# Patient Record
Sex: Male | Born: 1970 | Race: White | Hispanic: No | Smoking: Never smoker
Health system: Southern US, Community
[De-identification: ages and names within clinical notes are randomized; demographics above are authoritative.]

---

## 2008-07-20 ENCOUNTER — Emergency Department: Payer: Self-pay | Admitting: Emergency Medicine

## 2020-08-21 ENCOUNTER — Other Ambulatory Visit: Payer: Self-pay | Admitting: Occupational Medicine

## 2020-08-21 ENCOUNTER — Other Ambulatory Visit: Payer: Self-pay

## 2020-08-21 ENCOUNTER — Ambulatory Visit: Payer: Self-pay

## 2020-08-21 DIAGNOSIS — M79641 Pain in right hand: Secondary | ICD-10-CM

## 2020-09-28 ENCOUNTER — Ambulatory Visit
Admission: EM | Admit: 2020-09-28 | Discharge: 2020-09-28 | Disposition: A | Discharge status: Home / Self Care | Payer: BC Managed Care – PPO | Attending: Family Medicine | Admitting: Family Medicine

## 2020-09-28 ENCOUNTER — Other Ambulatory Visit: Payer: Self-pay

## 2020-09-28 DIAGNOSIS — Z20822 Contact with and (suspected) exposure to covid-19: Secondary | ICD-10-CM

## 2020-09-28 DIAGNOSIS — U071 COVID-19: Secondary | ICD-10-CM | POA: Diagnosis not present

## 2020-09-28 DIAGNOSIS — J209 Acute bronchitis, unspecified: Secondary | ICD-10-CM | POA: Insufficient documentation

## 2020-09-28 DIAGNOSIS — J111 Influenza due to unidentified influenza virus with other respiratory manifestations: Secondary | ICD-10-CM | POA: Insufficient documentation

## 2020-09-28 DIAGNOSIS — J029 Acute pharyngitis, unspecified: Secondary | ICD-10-CM | POA: Diagnosis present

## 2020-09-28 LAB — GROUP A STREP BY PCR: Group A Strep by PCR: NOT DETECTED

## 2020-09-28 MED ORDER — AZITHROMYCIN 500 MG PO TABS: 500.0000 mg | ORAL_TABLET | Freq: Every day | ORAL | 0 refills | Status: AC

## 2020-09-28 NOTE — ED Provider Notes (Signed)
[facility]    CSN: [id number] Arrival date & time: 09/28/20  1220      History   Chief Complaint Chief Complaint  Patient presents with  . Chills  . Sore Throat  . Fever  . Generalized Body Aches    HPI Kevin Wall is a 50 y.o. male who presents with onset of body aches, chills, HA, ST and fever of 102.X 3 days. HA is gone, body aches are better, but the ST is bad, even drinking water hurts. Took Dayquil and Motrin at prn which helped a little. Has been very fatigued. Denies SOB or CP.  Has been having night sweats. His cough is productive with green sputum.Had Covid injections last year. Has not been exposed to anyone ill that he knows of.  Had Dayquil at 10 am today.    History reviewed. No pertinent past medical history.  There are no problems to display for this patient.   History reviewed. No pertinent surgical history.   Home Medications    Prior to Admission medications   Medication Sig Start Date End Date Taking? Authorizing Provider  azithromycin (ZITHROMAX) 500 MG tablet Take 1 tablet (500 mg total) by mouth daily. 09/28/20  Yes Rodriguez-Southworth, Viviana Simpler    Family History History reviewed. No pertinent family history.  Social History Social History   Tobacco Use  . Smoking status: Never Smoker  . Smokeless tobacco: Never Used  Vaping Use  . Vaping Use: Never used  Substance Use Topics  . Alcohol use: Not Currently  . Drug use: Never     Allergies   Patient has no known allergies.   Review of Systems Review of Systems  Constitutional: Positive for activity change, appetite change, chills, diaphoresis, fatigue and fever.  HENT: Positive for postnasal drip, rhinorrhea, sore throat and trouble swallowing. Negative for congestion, ear discharge and ear pain.        Drinking water hurts and the ST wakes him up at night  Eyes: Negative for discharge.  Respiratory: Positive for cough. Negative for chest tightness.    Cardiovascular: Negative for chest pain.  Gastrointestinal: Positive for diarrhea. Negative for nausea and vomiting.  Musculoskeletal: Positive for myalgias. Negative for gait problem.  Skin: Negative for rash.  Neurological: Positive for headaches.    Physical Exam Triage Vital Signs ED Triage Vitals  Enc Vitals Group     BP 09/28/20 1228 (!) 127/103     Pulse Rate 09/28/20 1228 (!) 110     Resp 09/28/20 1228 18     Temp 09/28/20 1228 98.5 F (36.9 C)     Temp Source 09/28/20 1228 Oral     SpO2 09/28/20 1228 99 %     Weight 09/28/20 1229 174 lb (78.9 kg)     Height 09/28/20 1229 5' 7.5" (1.715 m)     Head Circumference --      Peak Flow --      Pain Score 09/28/20 1229 4     Pain Loc --      Pain Edu? --      Excl. in GC? --    No data found.  Updated Vital Signs BP (!) 127/103 (BP Location: Left Arm)   Pulse (!) 110   Temp 98.5 F (36.9 C) (Oral)   Resp 18   Ht 5' 7.5" (1.715 m)   Wt 174 lb (78.9 kg)   SpO2 99%   BMI 26.85 kg/m   Visual Acuity Right Eye Distance:  Left Eye Distance:   Bilateral Distance:    Right Eye Near:   Left Eye Near:    Bilateral Near:     Physical Exam Physical Exam Vitals signs and nursing note reviewed.  Constitutional:      General: he is not in acute distress.    Appearance: Normal appearance. he is not ill-appearing, toxic-appearing or diaphoretic.  HENT:     Head: Normocephalic.     Right Ear: Tympanic membrane, ear canal and external ear normal.     Left Ear: Tympanic membrane, ear canal and external ear normal.     Nose: Nose normal.     Mouth/Throat: moderate erythema with no exudates    Mouth: Mucous membranes are moist.  Eyes:     General: No scleral icterus.       Right eye: No discharge.        Left eye: No discharge.     Conjunctiva/sclera: Conjunctivae normal.  Neck:     Musculoskeletal: Neck supple. No neck rigidity. Has small cervical chain lymphadenopathy.   Cardiovascular:     Rate and Rhythm: Tachy  rate at 100 and regular rhythm.     Heart sounds: No murmur.  Pulmonary:     Effort: Pulmonary effort is normal.     Breath sounds: Normal breath sounds.   Musculoskeletal: Normal range of motion.  Lymphadenopathy:     Cervical: No cervical adenopathy.  Skin:    General: Skin is warm and dry.     Coloration: Skin is not jaundiced.     Findings: No rash.  Neurological:     Mental Status: he is alert and oriented to person, place, and time.     Gait: Gait normal.  Psychiatric:        Mood and Affect: Mood normal.        Behavior: Behavior normal.        Thought Content: Thought content normal.        Judgment: Judgment normal.    UC Treatments / Results  Labs (all labs ordered are listed, but only abnormal results are displayed) Labs Reviewed  GROUP A STREP BY PCR  SARS CORONAVIRUS 2 (TAT 6-24 HRS)  PCR strep test is neg.   EKG   Radiology No results found.  Procedures Procedures (including critical care time)  Medications Ordered in UC Medications - No data to display  Initial Impression / Assessment and Plan / UC Course  I have reviewed the triage vital signs and the nursing notes. Covid test is pending, I suspect he has it and is starting to resolve. I am concerned he has bacterial bronchitis with the green productive sputum he is bringing up and pharyngitis. I placed him on Azithromycin 500 mg qd x 5 days.  May continue current OTC meds for symptoms relief. See instructions.  Final Clinical Impressions(s) / UC Diagnoses   Final diagnoses:  Sore throat  Influenza-like illness  Acute bronchitis, unspecified organism  Suspected COVID-19 virus infection     Discharge Instructions     I will call you when the strep test is done In the mean time, continue doing what you are doing and I will send a Zpack for the green mucous you have been coughing.  Stay quarantined until Thursday.     ED Prescriptions    Medication Sig Dispense Auth. Provider    azithromycin (ZITHROMAX) 500 MG tablet Take 1 tablet (500 mg total) by mouth daily. 5 tablet Rodriguez-Southworth, Nettie Elm, [place]  PDMP not reviewed this encounter.   Garey Ham, Cordelia Poche 09/28/20 1839

## 2020-09-28 NOTE — Discharge Instructions (Addendum)
I will call you when the strep test is done In the mean time, continue doing what you are doing and I will send a Zpack for the green mucous you have been coughing.  Stay quarantined until Thursday.

## 2020-09-28 NOTE — ED Triage Notes (Signed)
Generalized bodyaches, chills fever, sore throat all starting on Friday

## 2020-09-29 LAB — SARS CORONAVIRUS 2 (TAT 6-24 HRS): SARS Coronavirus 2: POSITIVE — AB

## 2022-11-14 IMAGING — DX DG HAND COMPLETE 3+V*R*
3 series · 3 of 3 positions shown · non-contrast
Comparison: None.

CLINICAL DATA: Right hand pain after injury

EXAM:
RIGHT HAND - COMPLETE 3+ VIEW

[hand pa]
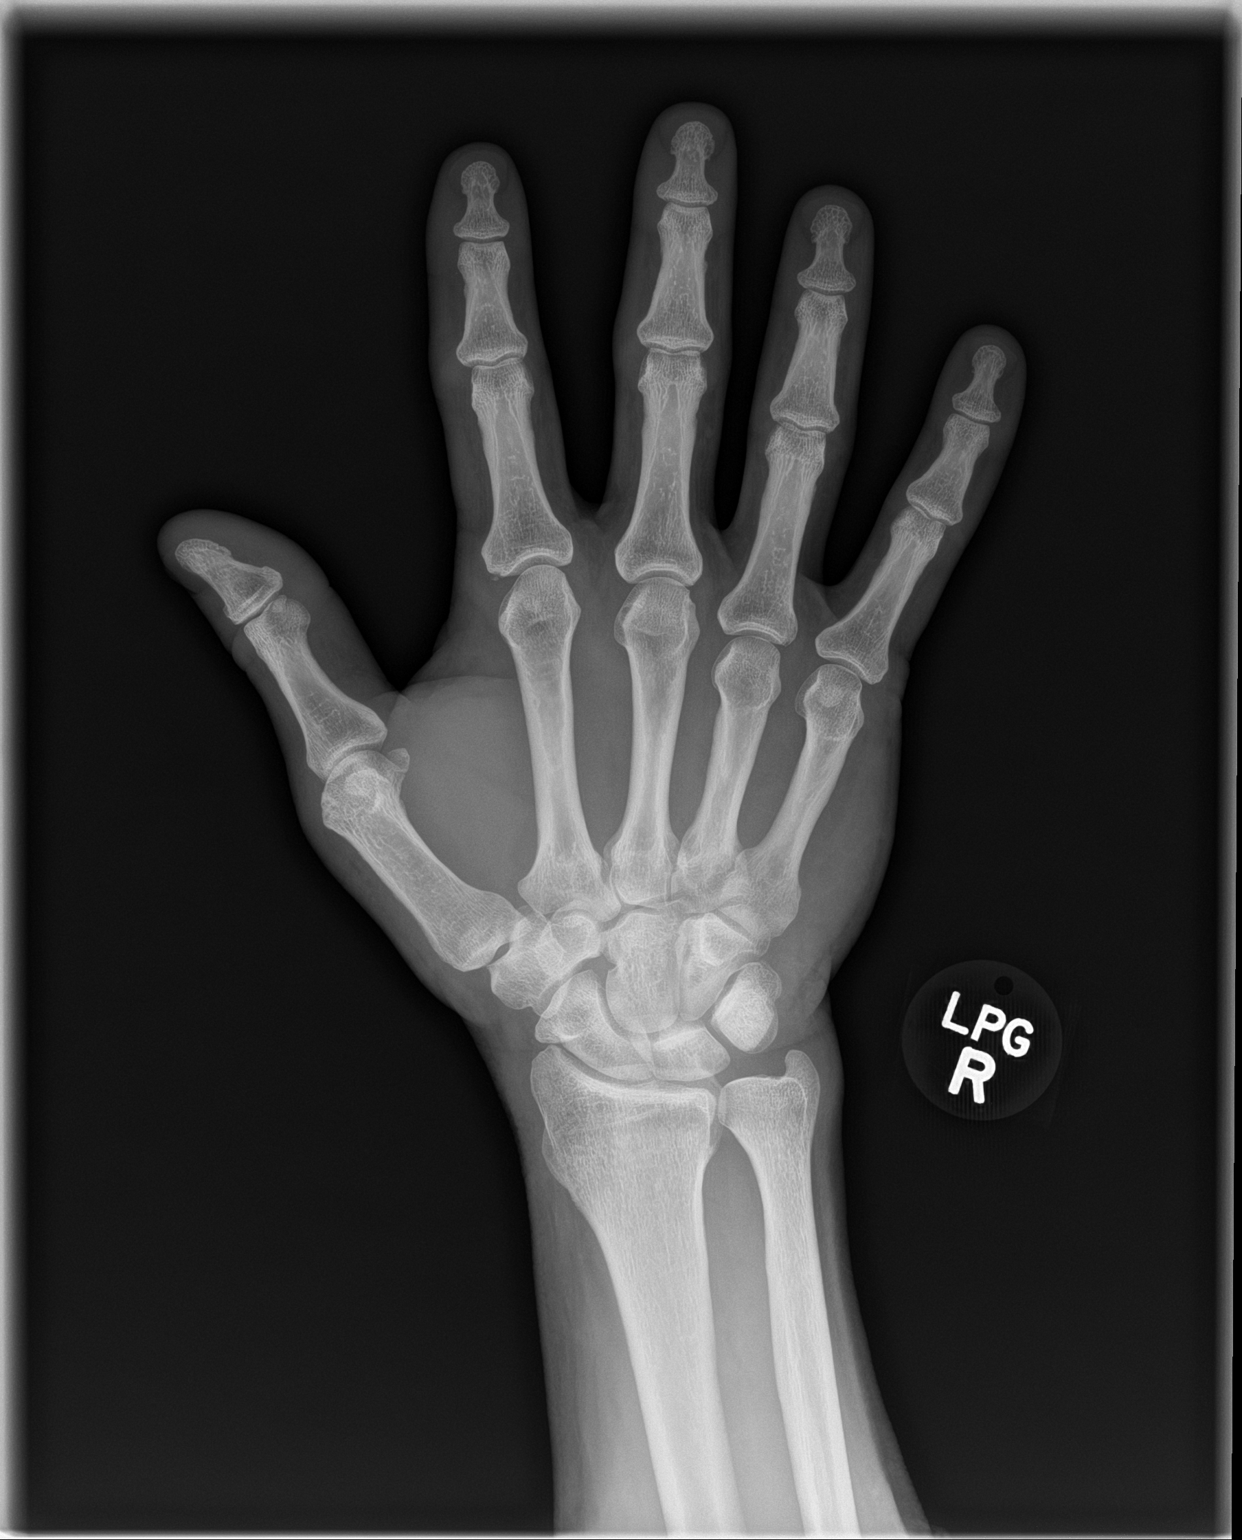

[hand obl]
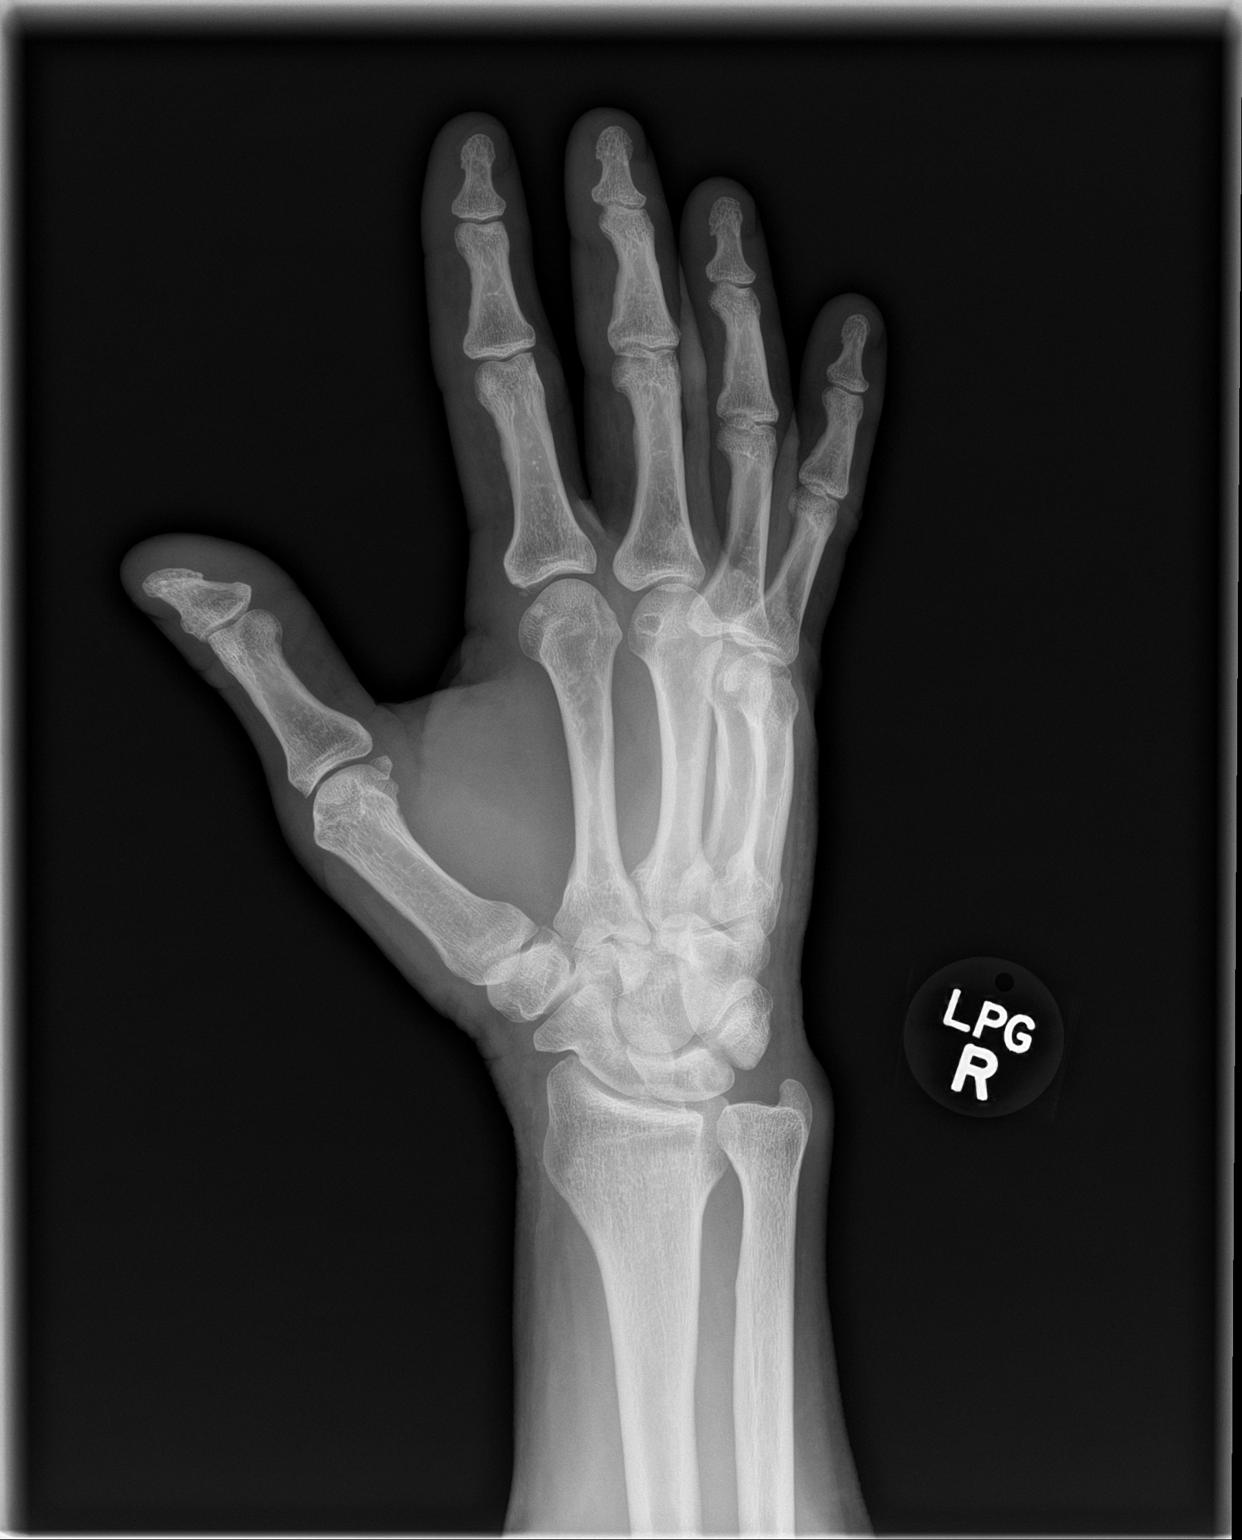

[hand lat]
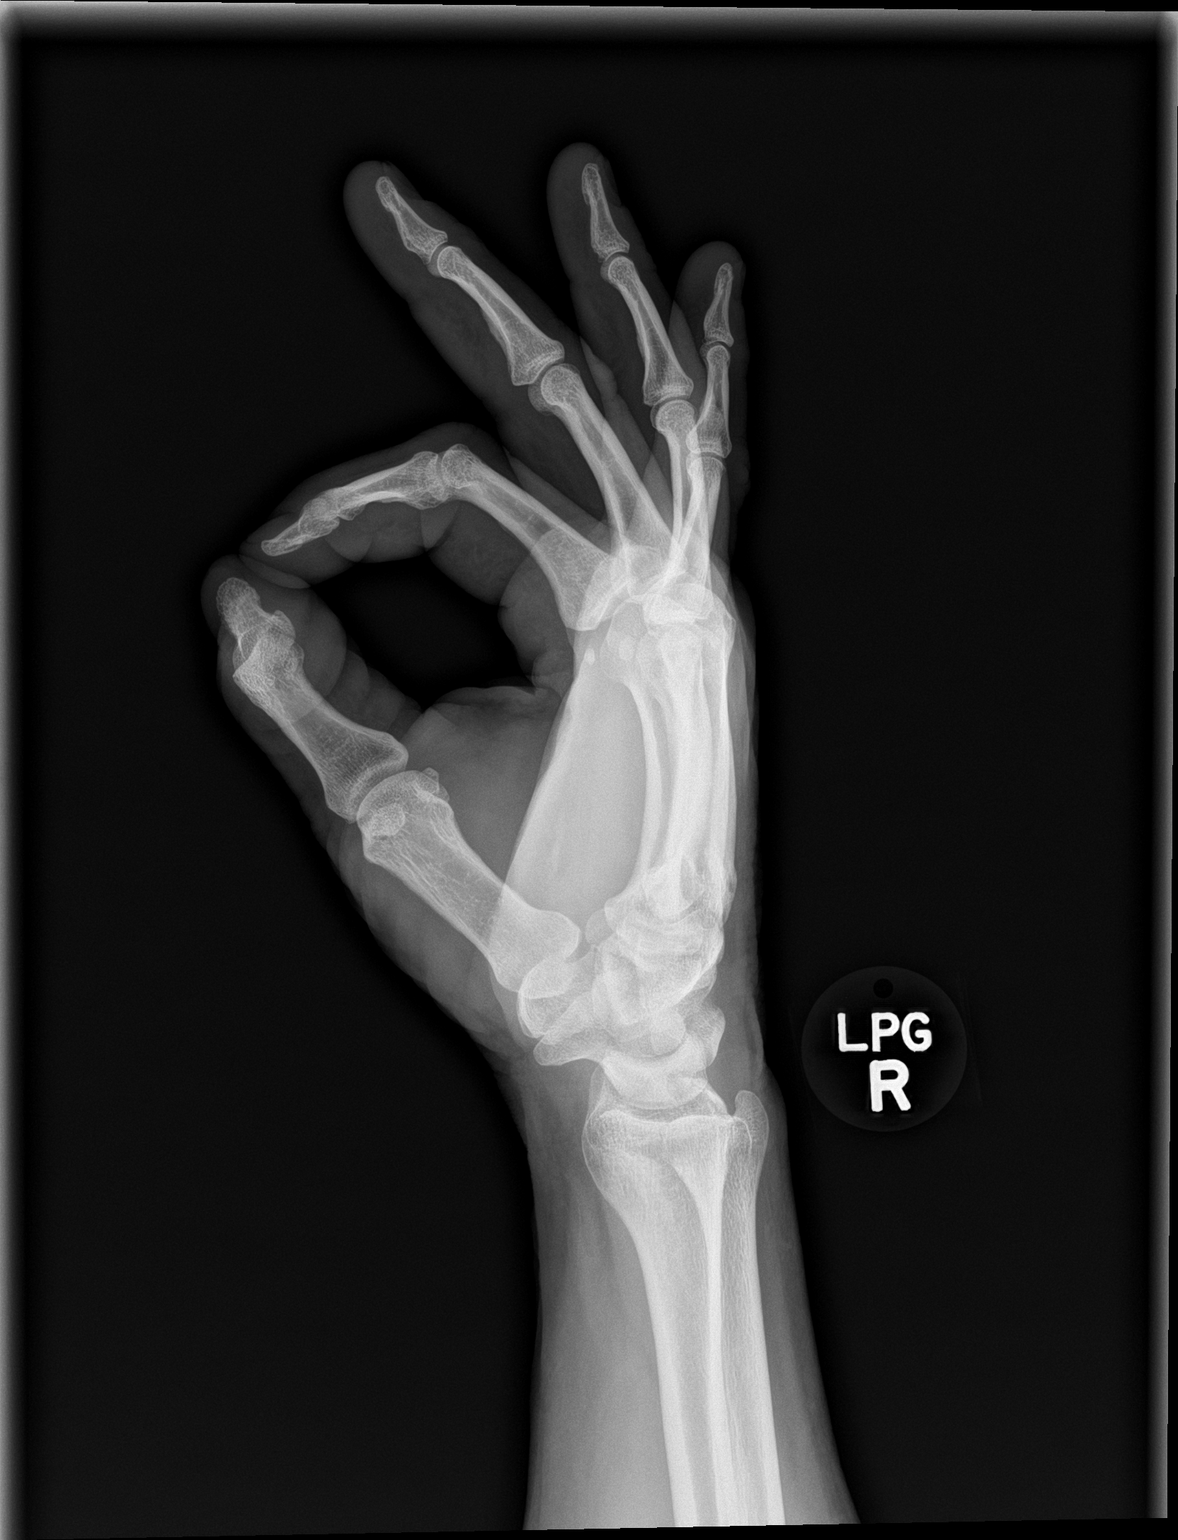

[3 of 3 positions shown; findings below may reference images not displayed]

FINDINGS: There is no evidence of fracture or dislocation. There is no
evidence of arthropathy or other focal bone abnormality. Soft
tissues are unremarkable.
IMPRESSION: Negative.
